# Patient Record
Sex: Male | Born: 1970 | Race: White | Hispanic: No | Smoking: Current every day smoker
Health system: Southern US, Community
[De-identification: ages and names within clinical notes are randomized; demographics above are authoritative.]

## PROBLEM LIST (undated history)

## (undated) DIAGNOSIS — K219 Gastro-esophageal reflux disease without esophagitis: Secondary | ICD-10-CM

## (undated) DIAGNOSIS — E119 Type 2 diabetes mellitus without complications: Secondary | ICD-10-CM

## (undated) DIAGNOSIS — G839 Paralytic syndrome, unspecified: Secondary | ICD-10-CM

---

## 2015-10-30 ENCOUNTER — Emergency Department
Admission: EM | Admit: 2015-10-30 | Discharge: 2015-10-30 | Disposition: A | Discharge status: Home / Self Care | Payer: BLUE CROSS/BLUE SHIELD | Attending: Emergency Medicine | Admitting: Emergency Medicine

## 2015-10-30 DIAGNOSIS — F17200 Nicotine dependence, unspecified, uncomplicated: Secondary | ICD-10-CM | POA: Insufficient documentation

## 2015-10-30 DIAGNOSIS — E119 Type 2 diabetes mellitus without complications: Secondary | ICD-10-CM | POA: Diagnosis not present

## 2015-10-30 DIAGNOSIS — R1030 Lower abdominal pain, unspecified: Secondary | ICD-10-CM | POA: Diagnosis present

## 2015-10-30 DIAGNOSIS — R110 Nausea: Secondary | ICD-10-CM | POA: Diagnosis not present

## 2015-10-30 HISTORY — DX: Type 2 diabetes mellitus without complications: E11.9

## 2015-10-30 LAB — COMPREHENSIVE METABOLIC PANEL
ALK PHOS: 64 U/L (ref 38–126)
ALT: 12 U/L — AB (ref 17–63)
AST: 16 U/L (ref 15–41)
Albumin: 4.3 g/dL (ref 3.5–5.0)
Anion gap: 10 (ref 5–15)
BUN: 20 mg/dL (ref 6–20)
CALCIUM: 9.4 mg/dL (ref 8.9–10.3)
CO2: 25 mmol/L (ref 22–32)
CREATININE: 1.09 mg/dL (ref 0.61–1.24)
Chloride: 104 mmol/L (ref 101–111)
GFR calc non Af Amer: >60 mL/min (ref >60)
GLUCOSE: 117 mg/dL — AB (ref 65–99)
Potassium: 4 mmol/L (ref 3.5–5.1)
SODIUM: 139 mmol/L (ref 135–145)
Total Bilirubin: 0.9 mg/dL (ref 0.3–1.2)
Total Protein: 7.3 g/dL (ref 6.5–8.1)

## 2015-10-30 LAB — URINALYSIS COMPLETE WITH MICROSCOPIC (ARMC ONLY)
Bacteria, UA: NONE SEEN
Bilirubin Urine: NEGATIVE
Hgb urine dipstick: NEGATIVE
Leukocytes, UA: NEGATIVE
Nitrite: NEGATIVE
Protein, ur: NEGATIVE mg/dL
SPECIFIC GRAVITY, URINE: 1.032 — AB (ref 1.005–1.030)
Squamous Epithelial / LPF: NONE SEEN
pH: 5 (ref 5.0–8.0)

## 2015-10-30 LAB — CBC
HCT: 49.6 % (ref 40.0–52.0)
Hemoglobin: 16.7 g/dL (ref 13.0–18.0)
MCH: 29.9 pg (ref 26.0–34.0)
MCHC: 33.6 g/dL (ref 32.0–36.0)
MCV: 88.9 fL (ref 80.0–100.0)
PLATELETS: 253 10*3/uL (ref 150–440)
RBC: 5.58 MIL/uL (ref 4.40–5.90)
RDW: 14 % (ref 11.5–14.5)
WBC: 15.5 10*3/uL — ABNORMAL HIGH (ref 3.8–10.6)

## 2015-10-30 LAB — LIPASE, BLOOD: Lipase: 19 U/L (ref 11–51)

## 2015-10-30 NOTE — ED Provider Notes (Signed)
[facility] Emergency Department Provider Note  ____________________________________________  Time seen: 1805  I have reviewed the triage vital signs and the nursing notes.  History by:  Patient along with his wife  HISTORY  Chief Complaint Abdominal Pain     HPI Jason Weeks is a 44 y.o. male who reports she has had abdominal pain on and off since this past Thursday. Thursday was the worst day, and symptoms have improved since then. Currently is having no pain.  Patient reports the pain is in his lower abdomen, primarily central. He reports some constipation. He usually has a bowel movement once a day. He reports that after he has had bowel movements over the past few days his pain has significantly eased. He had some discomfort today. He has very brief episodes that last just seconds. These occur approximately every 45 minutes. Had a bowel movement just before being seen in the emergency department and is currently pain-free.  Patient has had some nausea. There is been no vomiting. He denies any blood per rectum.  His wife gave him 1 dose of MiraLAX a few days ago. She also gave him a laxative last night. He's had 3 bowel movements today.   Past Medical History  Diagnosis Date  . Diabetes mellitus without complication (HCC)     There are no active problems to display for this patient.   History reviewed. No pertinent past surgical history.  No current outpatient prescriptions on file.  Allergies Codeine and Penicillins  No family history on file.  Social History Social History  Substance Use Topics  . Smoking status: Current Every Day Smoker  . Smokeless tobacco: None  . Alcohol Use: No    Review of Systems  Constitutional: Negative for fever/chills. ENT: Negative for congestion. Cardiovascular: Negative for chest pain. Respiratory: Negative for cough. Gastrointestinal: Some brief episodes of abdominal pain today, pain was worse when  it initiated this past Thursday. See history of present illness Genitourinary: Negative for dysuria. Musculoskeletal: No back pain. Skin: Negative for rash. Neurological: Negative for headache or focal weakness   10-point ROS otherwise negative.  ____________________________________________   PHYSICAL EXAM:  VITAL SIGNS: ED Triage Vitals  Enc Vitals Group     BP 10/30/15 1733 140/73 mmHg     Pulse Rate 10/30/15 1733 83     Resp 10/30/15 1733 17     Temp 10/30/15 1733 97.8 F (36.6 C)     Temp Source 10/30/15 1733 Oral     SpO2 10/30/15 1733 96 %     Weight 10/30/15 1733 187 lb (84.823 kg)     Height 10/30/15 1733 6\' 1"  (1.854 m)     Head Cir --      Peak Flow --      Pain Score 10/30/15 1734 4     Pain Loc --      Pain Edu? --      Excl. in GC? --     Constitutional:  Alert and oriented. Well appearing and in no distress. ENT   Head: Normocephalic and atraumatic.   Nose: No congestion/rhinnorhea.       Mouth: No erythema, no swelling   Cardiovascular: Normal rate, regular rhythm, no murmur noted Respiratory:  Normal respiratory effort, no tachypnea.    Breath sounds are clear and equal bilaterally.  Gastrointestinal: Soft, no distention. Nontender Back: No muscle spasm, no tenderness, no CVA tenderness. Musculoskeletal: No deformity noted. Nontender with normal range of motion in all extremities.  No  noted edema. Neurologic:  Communicative. Normal appearing spontaneous movement in all 4 extremities. No gross focal neurologic deficits are appreciated.  Skin:  Skin is warm, dry. No rash noted. Psychiatric: Mood and affect are normal. Speech and behavior are normal.  ____________________________________________    LABS (pertinent positives/negatives)  Labs Reviewed  COMPREHENSIVE METABOLIC PANEL - Abnormal; Notable for the following:    Glucose, Bld 117 (*)    ALT 12 (*)    All other components within normal limits  CBC - Abnormal; Notable for the  following:    WBC 15.5 (*)    All other components within normal limits  URINALYSIS COMPLETEWITH MICROSCOPIC ([facility] ONLY) - Abnormal; Notable for the following:    Color, Urine YELLOW (*)    APPearance CLEAR (*)    Glucose, UA >500 (*)    Ketones, ur TRACE (*)    Specific Gravity, Urine 1.032 (*)    All other components within normal limits  LIPASE, BLOOD     ____________________________________________  ____________________________________________   PROCEDURES    ____________________________________________   INITIAL IMPRESSION / ASSESSMENT AND PLAN / ED COURSE  Pertinent labs & imaging results that were available during my care of the patient were reviewed by me and considered in my medical decision making (see chart for details).  Well-appearing 44 year old male in no acute distress. He is ambulatory into the room. He denies any abdominal pain at this time.  He has not had any diarrhea. He reports some gurgling in his belly. He has episodic, intermittent discomfort. It does appear that this is related to bowel function.  He does have a white blood cell count of 15,000. He has no signs of appendicitis. There is no tenderness in the right lower quadrant. There is no peritoneal signs. He also does not have signs of diverticulitis.    I do not think that a CT scan is an appropriate study for this patient at this time. As mentioned above, he is pain-free. I discussed this with him and his wife and they agree. Also, given his lack of pain currently, no fever, no diarrhea, I do not see an indication for antibiotics.  Instead, I think the best approach is for the patient take MiraLAX for the next 4-5 days and follow with his primary physician. If his pain worsens during this time, I discussed with him the need to return to the emergency department for reevaluation and for possible CT scan.  ____________________________________________   FINAL CLINICAL IMPRESSION(S) / ED  DIAGNOSES  Final diagnoses:  Lower abdominal pain      Darien Ramus, MD 10/30/15 1825

## 2015-10-30 NOTE — ED Notes (Signed)
Pt c/o lower abd pain since Thursday with nausea and constipation, states he had a very small BM this morning..Marland Kitchen

## 2015-10-30 NOTE — Discharge Instructions (Signed)
At this time, you have no pain in her belly and her belly appears benign on exam. We discussed her white blood cell count which is elevated (15,000) but doesn't tell us much else. If you continue to have problems, he may need to have a CT scan. If her pain worsens, if you have fever, or if you have any other urgent concerns, return to the emergency department for further evaluation and possible CT. Take MiraLAX, one dose tonight, then start tomorrow morning with one dose once a day, for a total of 5 days. Follow-up with your regular doctor later this week or next week.  Abdominal Pain, Adult Many things can cause belly (abdominal) pain. Most times, the belly pain is not dangerous. Many cases of belly pain can be watched and treated at home. HOME CARE   Do not take medicines that help you go poop (laxatives) unless told to by your doctor.  Only take medicine as told by your doctor.  Eat or drink as told by your doctor. Your doctor will tell you if you should be on a special diet. GET HELP IF:  You do not know what is causing your belly pain.  You have belly pain while you are sick to your stomach (nauseous) or have runny poop (diarrhea).  You have pain while you pee or poop.  Your belly pain wakes you up at night.  You have belly pain that gets worse or better when you eat.  You have belly pain that gets worse when you eat fatty foods.  You have a fever. GET HELP RIGHT AWAY IF:   The pain does not go away within 2 hours.  You keep throwing up (vomiting).  The pain changes and is only in the right or left part of the belly.  You have bloody or tarry looking poop. MAKE SURE YOU:   Understand these instructions.  Will watch your condition.  Will get help right away if you are not doing well or get worse.   This information is not intended to replace advice given to you by your health care provider. Make sure you discuss any questions you have with your health care provider.   Document Released: 04/08/2008 Document Revised: 11/11/2014 Document Reviewed: 06/30/2013 Elsevier Interactive Patient Education [employer]2016 [employer] Inc.

## 2015-11-01 ENCOUNTER — Other Ambulatory Visit: Payer: Self-pay | Admitting: Internal Medicine

## 2015-11-01 DIAGNOSIS — R109 Unspecified abdominal pain: Secondary | ICD-10-CM

## 2015-11-02 ENCOUNTER — Ambulatory Visit
Admission: RE | Admit: 2015-11-02 | Discharge: 2015-11-02 | Disposition: A | Discharge status: Home / Self Care | Payer: BLUE CROSS/BLUE SHIELD | Source: Ambulatory Visit | Attending: Internal Medicine | Admitting: Internal Medicine

## 2015-11-02 DIAGNOSIS — R1031 Right lower quadrant pain: Secondary | ICD-10-CM | POA: Insufficient documentation

## 2015-11-02 DIAGNOSIS — R109 Unspecified abdominal pain: Secondary | ICD-10-CM

## 2015-11-02 MED ORDER — IOHEXOL 350 MG/ML SOLN
100.0000 mL | Freq: Once | INTRAVENOUS | Status: AC | PRN
  Administered 2015-11-02: 100 mL via INTRAVENOUS

## 2015-12-06 ENCOUNTER — Other Ambulatory Visit: Payer: Self-pay | Admitting: Gastroenterology

## 2015-12-06 DIAGNOSIS — R198 Other specified symptoms and signs involving the digestive system and abdomen: Secondary | ICD-10-CM

## 2015-12-20 ENCOUNTER — Ambulatory Visit
Admission: RE | Admit: 2015-12-20 | Discharge: 2015-12-20 | Disposition: A | Discharge status: Home / Self Care | Payer: BLUE CROSS/BLUE SHIELD | Source: Ambulatory Visit | Attending: Gastroenterology | Admitting: Gastroenterology

## 2015-12-20 DIAGNOSIS — R918 Other nonspecific abnormal finding of lung field: Secondary | ICD-10-CM | POA: Diagnosis not present

## 2015-12-20 DIAGNOSIS — R198 Other specified symptoms and signs involving the digestive system and abdomen: Secondary | ICD-10-CM

## 2015-12-20 DIAGNOSIS — R933 Abnormal findings on diagnostic imaging of other parts of digestive tract: Secondary | ICD-10-CM | POA: Diagnosis present

## 2015-12-20 LAB — POCT I-STAT CREATININE: Creatinine, Ser: 0.9 mg/dL (ref 0.61–1.24)

## 2015-12-20 MED ORDER — IOHEXOL 350 MG/ML SOLN
125.0000 mL | Freq: Once | INTRAVENOUS | Status: AC | PRN
  Administered 2015-12-20: 125 mL via INTRAVENOUS

## 2016-01-03 HISTORY — PX: UPPER GASTROINTESTINAL ENDOSCOPY: SHX188

## 2016-05-07 IMAGING — CT CT ENTEROGRAPHY (ABD-PELV W/ CM)
2 of 6 series · 15 of 46 positions shown, 17 images · IV contrast (omnipaque)
Comparison: 11/02/2015

CLINICAL DATA: Constipation for 3 days. Small bowel wall thickening
on prior CT.

EXAM:
CT ABDOMEN AND PELVIS WITH CONTRAST (ENTEROGRAPHY)
TECHNIQUE: Multidetector CT of the abdomen and pelvis during bolus
administration of intravenous contrast. Negative oral contrast
VoLumen was given.
CONTRAST:  125mL OMNIPAQUE IOHEXOL 350 MG/ML SOLN

[Series 3: routine abd pel thins · axial · 0.71mm/px · z∈[-984,-554]mm · 12 of 243 slices shown, 14 images]
[im 14/243  soft-tissue]
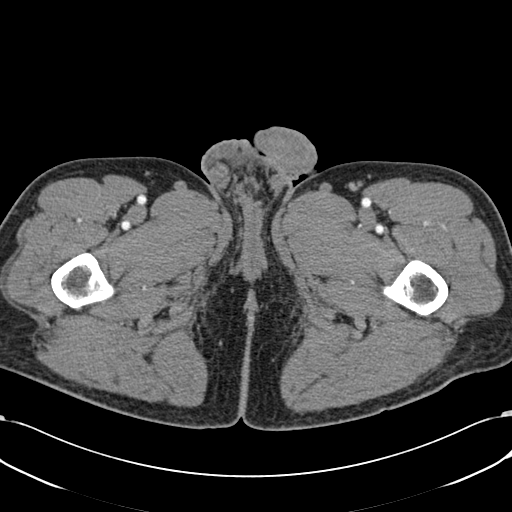
[im 14/243  bone]
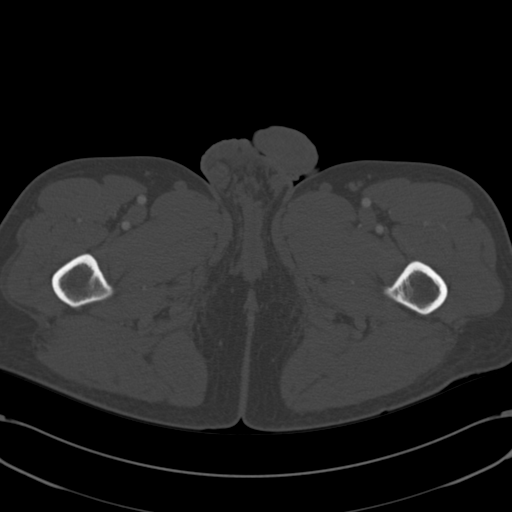
[im 41/243  soft-tissue]
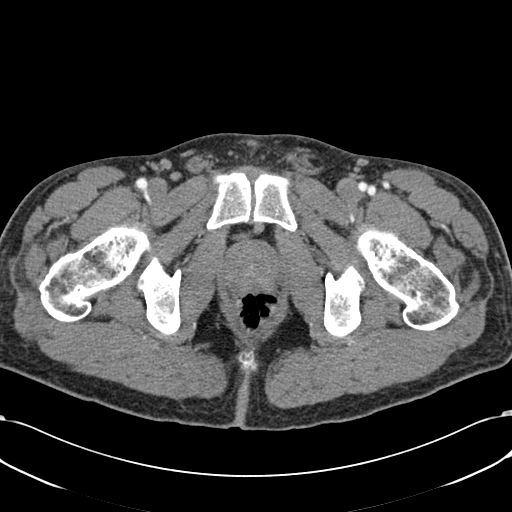
[im 54/243  soft-tissue]
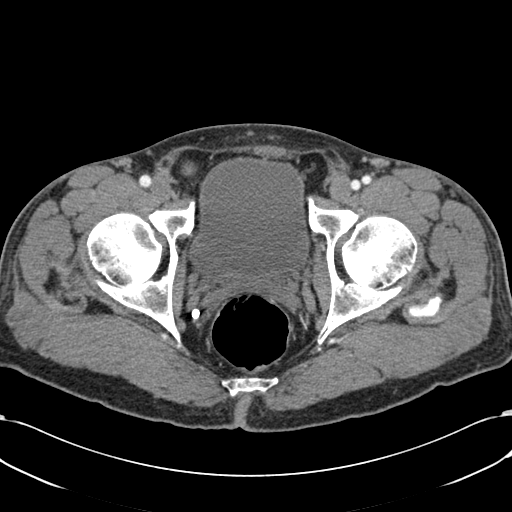
[im 68/243  soft-tissue]
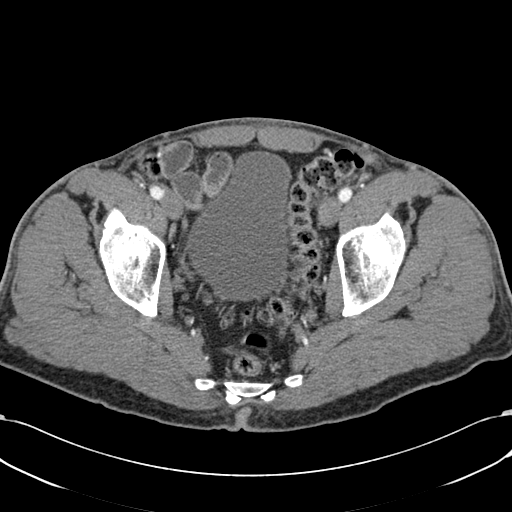
[im 95/243  soft-tissue]
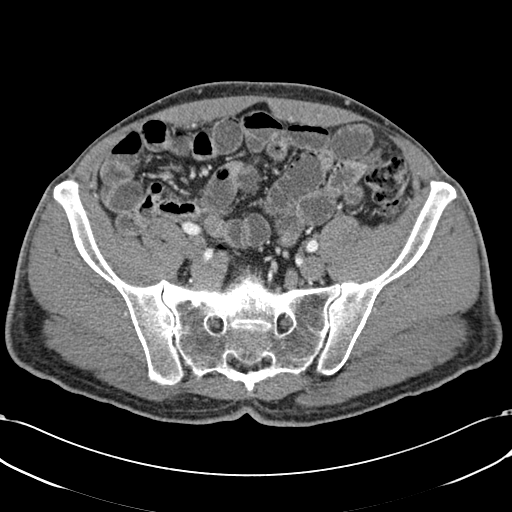
[im 108/243  soft-tissue]
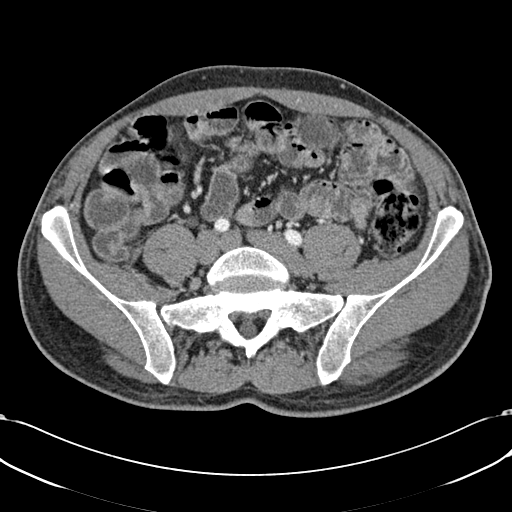
[im 135/243  soft-tissue]
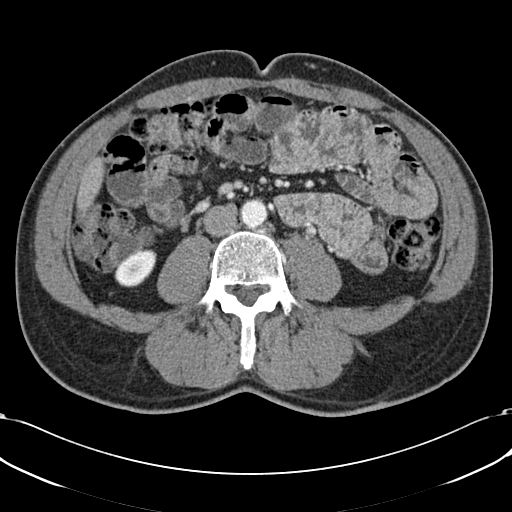
[im 148/243  soft-tissue]
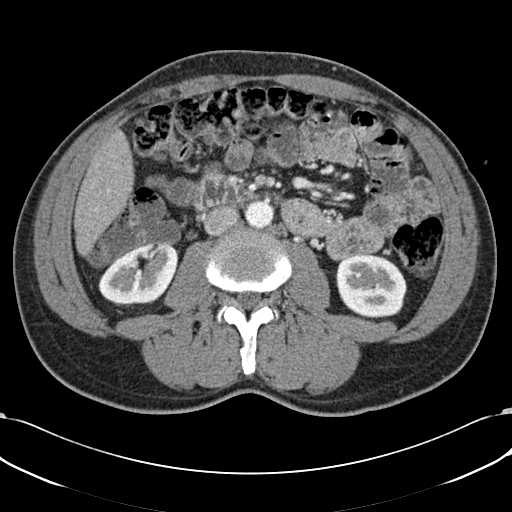
[im 175/243  soft-tissue]
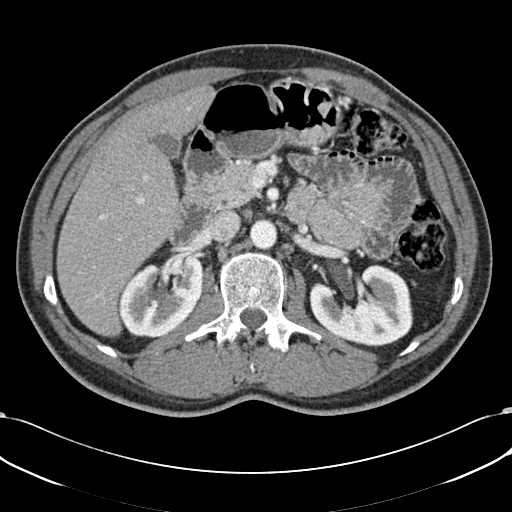
[im 175/243  bone]
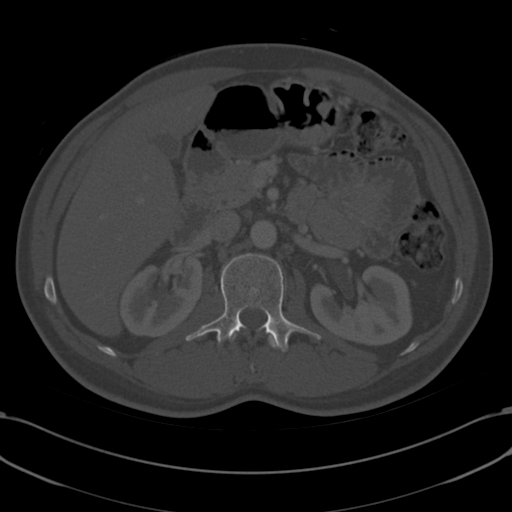
[im 189/243  soft-tissue]
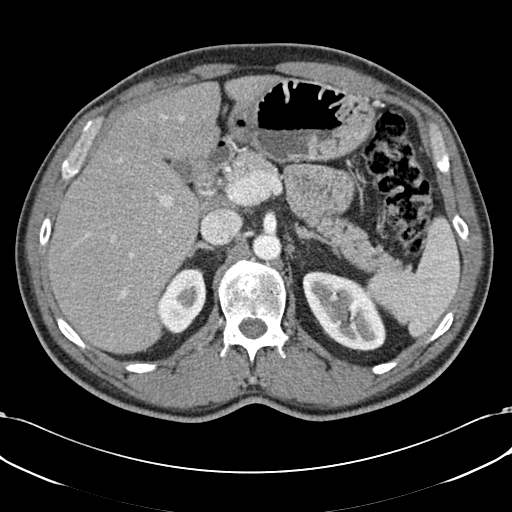
[im 202/243  soft-tissue]
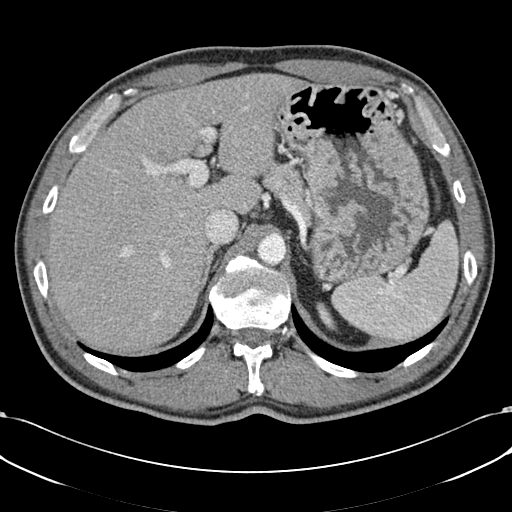
[im 229/243  soft-tissue]
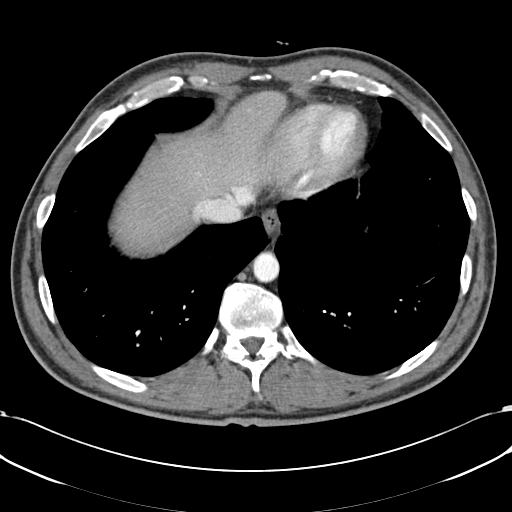

[Series 6: cor routine abd pel with · coronal · 0.73mm/px · 3 of 134 slices shown]
[im 45/134  soft-tissue]
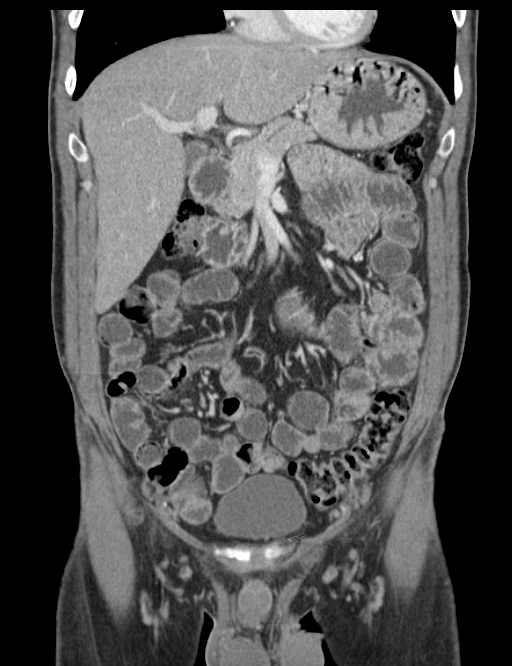
[im 60/134  soft-tissue]
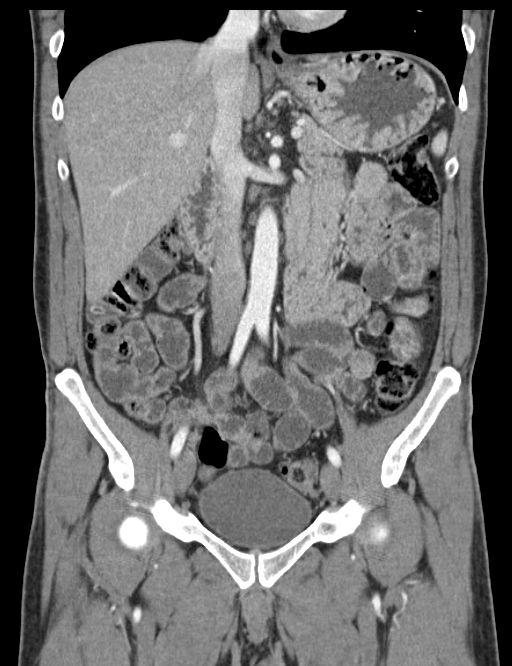
[im 74/134  soft-tissue]
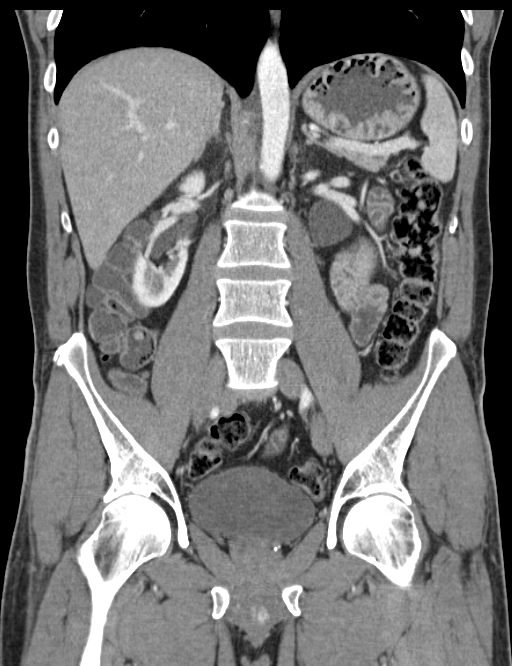

[15 of 46 positions shown; findings below may reference images not displayed]

FINDINGS: Lower chest: 1 mm right lower lobe pulmonary nodule on image
6/series 5, similar. Normal heart size without pericardial or
pleural effusion.

Hepatobiliary: Moderate hepatomegaly, 20.1 cm craniocaudal. No focal
liver lesion. Normal gallbladder, without biliary ductal dilatation.

Pancreas: Normal, without mass or ductal dilatation.

Spleen: Normal in size, without focal abnormality.

Adrenals/Urinary Tract: Normal adrenal glands. Left extrarenal
pelvis. Mildly malrotated right kidney. Normal urinary bladder.

Stomach/Bowel: Greater curvature gastric wall thickening is mild,
including 2.4 cm on image 17/series 2. Similar. Normal colon,
appendix, and terminal ileum. The extent of small bowel distension
with neutral contrast is moderate. No evidence of small bowel mass,
wall thickening, mucosal hyperenhancement, or mesenteric
abnormality. The area of mid small bowel underdistention and
possible wall thickening has resolved and was likely due to
peristalsis.

Vascular/Lymphatic: Normal caliber of the aorta and branch vessels.
No abdominopelvic adenopathy.

Reproductive: Normal prostate.

Other: No significant free fluid.

Musculoskeletal: Degenerative sclerosis of the right sacroiliac
joint.
IMPRESSION: 1. No evidence of small bowel pathology. The previously questioned
abnormality within the mid small bowel has resolved and was likely
due to peristalsis.
2. Mild greater curvature gastric wall thickening, suspicious for
gastritis.
3. Tiny right lung base nodule. If the patient is at high risk for
bronchogenic carcinoma, follow-up chest CT at 1 year is recommended.
If the patient is at low risk, no follow-up is needed. This
recommendation follows the consensus statement: "Guidelines for
Management of Small Pulmonary Nodules Detected on CT Scans: A
Statement from the [HOSPITAL]" as published in Radiology
9551; [DATE]. Available online at:
[URL]
4. Hepatomegaly.

## 2020-07-04 ENCOUNTER — Telehealth: Payer: Self-pay

## 2020-07-04 NOTE — Telephone Encounter (Signed)
Contacted patient for lung CT screening clinic after receiving referral from Dr. Marcello Fennel.  Person who answered phone states Jason Weeks is not available and I agreed someone from our clinic will call him at a later time.  As a side note, patient will qualify for CT screening clinic until he is 31 (he will be 50 on May 16).  Patient will be informed of this when contact made.

## 2020-07-05 ENCOUNTER — Telehealth: Payer: Self-pay

## 2020-07-05 NOTE — Telephone Encounter (Signed)
Contacted patient for lung CT screening program after receiving referral from Dr. Marcello Fennel.  I explained to patient that our program served patients between the ages of 42 and 29 (patient will be 30 on Mar 19 2021).  Patient is agreeable for our program to call him in May of 2022 to see if he would qualify for program. Patient thanked me for the call.

## 2021-03-02 ENCOUNTER — Telehealth: Payer: Self-pay | Admitting: *Deleted

## 2021-03-02 NOTE — Telephone Encounter (Signed)
Received referral for low dose lung cancer screening CT scan. Message left at phone number listed in EMR for patient to call me back to facilitate scheduling scan. Patient will need to be scheduled after 03/19/21.

## 2021-11-01 ENCOUNTER — Ambulatory Visit: Payer: Self-pay | Admitting: General Surgery

## 2021-11-01 NOTE — H&P (View-Only) (Signed)
PATIENT PROFILE: Jason Weeks is a 50 y.o. male who presents to the Clinic for consultation at the request of Dr. Ginette Pitman for evaluation of left inguinal hernia.  PCP:  Azzie Glatter, MD  HISTORY OF PRESENT ILLNESS: [name] reports feeling a left knee hernia since a year ago.  He endorses that he feels pain in the left groin.  Pain does not radiate to other part of the body.  Pain is aggravated by lifting heavy things.  Alleviating factor is laying down resting and reducing the hernia.  Pain continues to increase to the point that it is getting more difficult to reduce the hernia and getting more painful.  Denies any episode of abdominal distention nausea or vomiting.  Patient denies any intra-abdominal surgeries.   PROBLEM LIST: Problem List  Date Reviewed: 04/30/2021          Noted   Inguinal hernia, left 02/25/2020   Tobacco dependence Unknown   Type 2 diabetes mellitus without complication, without long-term current use of insulin (CMS-HCC) 12/11/2015   Moderate tobacco use disorder 12/11/2015   GERD without esophagitis 12/11/2015    GENERAL REVIEW OF SYSTEMS:   General ROS: negative for - chills, fatigue, fever, weight gain or weight loss Allergy and Immunology ROS: negative for - hives  Hematological and Lymphatic ROS: negative for - bleeding problems or bruising, negative for palpable nodes Endocrine ROS: negative for - heat or cold intolerance, hair changes Respiratory ROS: negative for - cough, shortness of breath or wheezing Cardiovascular ROS: no chest pain or palpitations GI ROS: negative for nausea, vomiting, abdominal pain, diarrhea, constipation Musculoskeletal ROS: negative for - joint swelling or muscle pain Neurological ROS: negative for - confusion, syncope Dermatological ROS: negative for pruritus and rash Psychiatric: negative for anxiety, depression, difficulty sleeping and memory loss  MEDICATIONS: Current Outpatient Medications  Medication Sig Dispense  Refill   aspirin 81 MG EC tablet Take 81 mg by mouth once daily.     blood-glucose meter (CONTOUR NEXT METER) Misc Use daily to check blood sugars. CONTOUR NEXT METER e11.9 1 each 0   dapagliflozin (FARXIGA) 10 mg tablet Take 1 tablet (10 mg total) by mouth every morning 90 tablet 1   esomeprazole (NEXIUM) 20 MG DR capsule Take 20 mg by mouth once daily.     JANUMET 50-1,000 mg tablet Take 1 tablet by mouth 2 (two) times daily 180 tablet 1   multivitamin tablet Take 1 tablet by mouth once daily.     blood glucose diagnostic (CONTOUR NEXT TEST STRIPS) test strip Use 2 (two) times daily Contour next strips e11.9 200 each 1   HYDROcodone-acetaminophen (NORCO) 7.5-325 mg tablet  (Patient not taking: Reported on 11/01/2021)     No current facility-administered medications for this visit.    ALLERGIES: Codeine and Penicillin  PAST MEDICAL HISTORY: Past Medical History:  Diagnosis Date   Gastritis 01/04/2016   Intestinal metaplasia of gastric mucosa 01/04/2016   Tobacco dependence    Type 2 diabetes mellitus (CMS-HCC) diagnosis 2012    PAST SURGICAL HISTORY: Past Surgical History:  Procedure Laterality Date   EGD  01/04/2016   Intestinal metaplasia/Gastritis/No Repeat/MGR     FAMILY HISTORY: Family History  Problem Relation Age of Onset   Diabetes Mother    Brain cancer Mother    Diabetes Brother    Diabetes Other    High blood pressure (Hypertension) Other    Brain cancer Other    Stomach cancer Other    No Known  Problems Father    Colon cancer Neg Hx    Colon polyps Neg Hx    Inflammatory bowel disease Neg Hx      SOCIAL HISTORY: Social History   Socioeconomic History   Marital status: Married  Tobacco Use   Smoking status: Every Day    Packs/day: 1.50    Types: Cigarettes   Smokeless tobacco: Never  Substance and Sexual Activity   Alcohol use: No    Alcohol/week: 0.0 standard drinks   Drug use: No   Sexual activity: Defer    PHYSICAL EXAM: Vitals:    11/01/21 0815  BP: 116/75  Pulse: 66   Body mass index is 25.2 kg/m. Weight: 86.6 kg (191 lb)   GENERAL: Alert, active, oriented x3  HEENT: Pupils equal reactive to light. Extraocular movements are intact. Sclera clear. Palpebral conjunctiva normal red color.Pharynx clear.  NECK: Supple with no palpable mass and no adenopathy.  LUNGS: Sound clear with no rales rhonchi or wheezes.  HEART: Regular rhythm S1 and S2 without murmur.  ABDOMEN: Soft and depressible, nontender with no palpable mass, no hepatomegaly.  Left inguinal hernia, reducible.  EXTREMITIES: Well-developed well-nourished symmetrical with no dependent edema.  NEUROLOGICAL: Awake alert oriented, facial expression symmetrical, moving all extremities.  REVIEW OF DATA: I have reviewed the following data today: Appointment on 08/31/2021  Component Date Value   WBC (White Blood Cell Co* 08/31/2021 9.8    RBC (Red Blood Cell Coun* 08/31/2021 5.44    Hemoglobin 08/31/2021 16.7    Hematocrit 08/31/2021 50.5    MCV (Mean Corpuscular Vo* 08/31/2021 92.8    MCH (Mean Corpuscular He* 08/31/2021 30.7    MCHC (Mean Corpuscular H* 08/31/2021 33.1    Platelet Count 08/31/2021 247    RDW-CV (Red Cell Distrib* 08/31/2021 14.0    MPV (Mean Platelet Volum* 08/31/2021 9.7    Neutrophils 08/31/2021 5.28    Lymphocytes 08/31/2021 3.18    Monocytes 08/31/2021 0.93    Eosinophils 08/31/2021 0.32    Basophils 08/31/2021 0.06    Neutrophil % 08/31/2021 53.9    Lymphocyte % 08/31/2021 32.5    Monocyte % 08/31/2021 9.5    Eosinophil % 08/31/2021 3.3    Basophil% 08/31/2021 0.6    Immature Granulocyte % 08/31/2021 0.2    Immature Granulocyte Cou* 08/31/2021 0.02    Glucose 08/31/2021 153 (H)    Sodium 08/31/2021 139    Potassium 08/31/2021 4.4    Chloride 08/31/2021 103    Carbon Dioxide (CO2) 08/31/2021 27.5    Urea Nitrogen (BUN) 08/31/2021 17    Creatinine 08/31/2021 1.1    Glomerular Filtration Ra* 08/31/2021 71    Calcium  08/31/2021 9.5    AST  08/31/2021 14    ALT  08/31/2021 12    Alk Phos (alkaline Phosp* 08/31/2021 70    Albumin 08/31/2021 4.3    Bilirubin, Total 08/31/2021 0.7    Protein, Total 08/31/2021 6.6    A/G Ratio 08/31/2021 1.9    Hemoglobin A1C 08/31/2021 7.6 (H)    Average Blood Glucose (C* 08/31/2021 171    Cholesterol, Total 08/31/2021 167    Triglyceride 08/31/2021 70    HDL (High Density Lipopr* 08/31/2021 47.0    LDL Calculated 08/31/2021 106    VLDL Cholesterol 08/31/2021 14    Cholesterol/HDL Ratio 08/31/2021 3.6    Creatinine, Random Urine 08/31/2021 53.7    Urine Albumin, Random 08/31/2021 <7    Urine Albumin/Creatinine* 08/31/2021 <13.0    Color 08/31/2021 Yellow  Clarity 08/31/2021 Clear    Specific Gravity 08/31/2021 1.010    pH, Urine 08/31/2021 5.5    Protein, Urinalysis 08/31/2021 Negative    Glucose, Urinalysis 08/31/2021 >=1000 (!)    Ketones, Urinalysis 08/31/2021 Negative    Blood, Urinalysis 08/31/2021 Trace (!)    Nitrite, Urinalysis 08/31/2021 Negative    Leukocyte Esterase, Urin* 08/31/2021 Negative    White Blood Cells, Urina* 08/31/2021 None Seen    Red Blood Cells, Urinaly* 08/31/2021 0-3    Bacteria, Urinalysis 08/31/2021 None Seen    Squamous Epithelial Cell* 08/31/2021 None Seen    PSA (Prostate Specific A* 08/31/2021 0.74      ASSESSMENT: Mr. Mcbryar is a 50 y.o. male presenting for consultation for left versus bilateral inguinal hernia.    The patient presents with a symptomatic left versus bilateral, reducible inguinal hernia. Patient was oriented about the diagnosis of inguinal hernia and its implication. The patient was oriented about the treatment alternatives (observation vs surgical repair). Due to patient symptoms, repair is recommended. Patient oriented about the surgical procedure, the use of mesh and its risk of complications such as: infection, bleeding, injury to vas deference, vasculature and testicle, injury to bowel or bladder, and  chronic pain.   Since the pain continue to increase especially with activity and sitting more difficult to reduce the hernia, I think that is reasonable to proceed with hernia repair.  I discussed with the patient that he has increased risk of recurrence and complication due to his hemoglobin A1c of 7.6 and he is currently smoking.  Patient not willing to stop smoking at this moment.  Non-recurrent unilateral inguinal hernia without obstruction or gangrene [K40.90]  PLAN: 1.  Robotic assisted laparoscopic left vs bilateral inguinal hernia repair with mesh (26691) 2.  CBC, CMP done 3.  Avoid taking aspirin 5 days before procedure 4.  Contact us if has any question or concern.   Patient verbalized understanding, all questions were answered, and were agreeable with the plan outlined above.    Herbert Pun, MD  Electronically signed by Herbert Pun, MD

## 2021-11-01 NOTE — H&P (Signed)
PATIENT PROFILE: Jason Weeks is a 50 y.o. male who presents to the Clinic for consultation at the request of Dr. Ginette Pitman for evaluation of left inguinal hernia.  PCP:  Azzie Glatter, MD  HISTORY OF PRESENT ILLNESS: Jason Weeks reports feeling a left knee hernia since a year ago.  He endorses that he feels pain in the left groin.  Pain does not radiate to other part of the body.  Pain is aggravated by lifting heavy things.  Alleviating factor is laying down resting and reducing the hernia.  Pain continues to increase to the point that it is getting more difficult to reduce the hernia and getting more painful.  Denies any episode of abdominal distention nausea or vomiting.  Patient denies any intra-abdominal surgeries.   PROBLEM LIST: Problem List  Date Reviewed: 04/30/2021          Noted   Inguinal hernia, left 02/25/2020   Tobacco dependence Unknown   Type 2 diabetes mellitus without complication, without long-term current use of insulin (CMS-HCC) 12/11/2015   Moderate tobacco use disorder 12/11/2015   GERD without esophagitis 12/11/2015    GENERAL REVIEW OF SYSTEMS:   General ROS: negative for - chills, fatigue, fever, weight gain or weight loss Allergy and Immunology ROS: negative for - hives  Hematological and Lymphatic ROS: negative for - bleeding problems or bruising, negative for palpable nodes Endocrine ROS: negative for - heat or cold intolerance, hair changes Respiratory ROS: negative for - cough, shortness of breath or wheezing Cardiovascular ROS: no chest pain or palpitations GI ROS: negative for nausea, vomiting, abdominal pain, diarrhea, constipation Musculoskeletal ROS: negative for - joint swelling or muscle pain Neurological ROS: negative for - confusion, syncope Dermatological ROS: negative for pruritus and rash Psychiatric: negative for anxiety, depression, difficulty sleeping and memory loss  MEDICATIONS: Current Outpatient Medications  Medication Sig Dispense  Refill   aspirin 81 MG EC tablet Take 81 mg by mouth once daily.     blood-glucose meter (CONTOUR NEXT METER) Misc Use daily to check blood sugars. CONTOUR NEXT METER e11.9 1 each 0   dapagliflozin (FARXIGA) 10 mg tablet Take 1 tablet (10 mg total) by mouth every morning 90 tablet 1   esomeprazole (NEXIUM) 20 MG DR capsule Take 20 mg by mouth once daily.     JANUMET 50-1,000 mg tablet Take 1 tablet by mouth 2 (two) times daily 180 tablet 1   multivitamin tablet Take 1 tablet by mouth once daily.     blood glucose diagnostic (CONTOUR NEXT TEST STRIPS) test strip Use 2 (two) times daily Contour next strips e11.9 200 each 1   HYDROcodone-acetaminophen (NORCO) 7.5-325 mg tablet  (Patient not taking: Reported on 11/01/2021)     No current facility-administered medications for this visit.    ALLERGIES: Codeine and Penicillin  PAST MEDICAL HISTORY: Past Medical History:  Diagnosis Date   Gastritis 01/04/2016   Intestinal metaplasia of gastric mucosa 01/04/2016   Tobacco dependence    Type 2 diabetes mellitus (CMS-HCC) diagnosis 2012    PAST SURGICAL HISTORY: Past Surgical History:  Procedure Laterality Date   EGD  01/04/2016   Intestinal metaplasia/Gastritis/No Repeat/MGR     FAMILY HISTORY: Family History  Problem Relation Age of Onset   Diabetes Mother    Brain cancer Mother    Diabetes Brother    Diabetes Other    High blood pressure (Hypertension) Other    Brain cancer Other    Stomach cancer Other    No Known  Problems Father    Colon cancer Neg Hx    Colon polyps Neg Hx    Inflammatory bowel disease Neg Hx      SOCIAL HISTORY: Social History   Socioeconomic History   Marital status: Married  Tobacco Use   Smoking status: Every Day    Packs/day: 1.50    Types: Cigarettes   Smokeless tobacco: Never  Substance and Sexual Activity   Alcohol use: No    Alcohol/week: 0.0 standard drinks   Drug use: No   Sexual activity: Defer    PHYSICAL EXAM: Vitals:    11/01/21 0815  BP: 116/75  Pulse: 66   Body mass index is 25.2 kg/m. Weight: 86.6 kg (191 lb)   GENERAL: Alert, active, oriented x3  HEENT: Pupils equal reactive to light. Extraocular movements are intact. Sclera clear. Palpebral conjunctiva normal red color.Pharynx clear.  NECK: Supple with no palpable mass and no adenopathy.  LUNGS: Sound clear with no rales rhonchi or wheezes.  HEART: Regular rhythm S1 and S2 without murmur.  ABDOMEN: Soft and depressible, nontender with no palpable mass, no hepatomegaly.  Left inguinal hernia, reducible.  EXTREMITIES: Well-developed well-nourished symmetrical with no dependent edema.  NEUROLOGICAL: Awake alert oriented, facial expression symmetrical, moving all extremities.  REVIEW OF DATA: I have reviewed the following data today: Appointment on 08/31/2021  Component Date Value   WBC (White Blood Cell Co* 08/31/2021 9.8    RBC (Red Blood Cell Coun* 08/31/2021 5.44    Hemoglobin 08/31/2021 16.7    Hematocrit 08/31/2021 50.5    MCV (Mean Corpuscular Vo* 08/31/2021 92.8    MCH (Mean Corpuscular He* 08/31/2021 30.7    MCHC (Mean Corpuscular H* 08/31/2021 33.1    Platelet Count 08/31/2021 247    RDW-CV (Red Cell Distrib* 08/31/2021 14.0    MPV (Mean Platelet Volum* 08/31/2021 9.7    Neutrophils 08/31/2021 5.28    Lymphocytes 08/31/2021 3.18    Monocytes 08/31/2021 0.93    Eosinophils 08/31/2021 0.32    Basophils 08/31/2021 0.06    Neutrophil % 08/31/2021 53.9    Lymphocyte % 08/31/2021 32.5    Monocyte % 08/31/2021 9.5    Eosinophil % 08/31/2021 3.3    Basophil% 08/31/2021 0.6    Immature Granulocyte % 08/31/2021 0.2    Immature Granulocyte Cou* 08/31/2021 0.02    Glucose 08/31/2021 153 (H)    Sodium 08/31/2021 139    Potassium 08/31/2021 4.4    Chloride 08/31/2021 103    Carbon Dioxide (CO2) 08/31/2021 27.5    Urea Nitrogen (BUN) 08/31/2021 17    Creatinine 08/31/2021 1.1    Glomerular Filtration Ra* 08/31/2021 71    Calcium  08/31/2021 9.5    AST  08/31/2021 14    ALT  08/31/2021 12    Alk Phos (alkaline Phosp* 08/31/2021 70    Albumin 08/31/2021 4.3    Bilirubin, Total 08/31/2021 0.7    Protein, Total 08/31/2021 6.6    A/G Ratio 08/31/2021 1.9    Hemoglobin A1C 08/31/2021 7.6 (H)    Average Blood Glucose (C* 08/31/2021 171    Cholesterol, Total 08/31/2021 167    Triglyceride 08/31/2021 70    HDL (High Density Lipopr* 08/31/2021 47.0    LDL Calculated 08/31/2021 106    VLDL Cholesterol 08/31/2021 14    Cholesterol/HDL Ratio 08/31/2021 3.6    Creatinine, Random Urine 08/31/2021 53.7    Urine Albumin, Random 08/31/2021 <7    Urine Albumin/Creatinine* 08/31/2021 <13.0    Color 08/31/2021 Yellow  Clarity 08/31/2021 Clear    Specific Gravity 08/31/2021 1.010    pH, Urine 08/31/2021 5.5    Protein, Urinalysis 08/31/2021 Negative    Glucose, Urinalysis 08/31/2021 >=1000 (!)    Ketones, Urinalysis 08/31/2021 Negative    Blood, Urinalysis 08/31/2021 Trace (!)    Nitrite, Urinalysis 08/31/2021 Negative    Leukocyte Esterase, Urin* 08/31/2021 Negative    White Blood Cells, Urina* 08/31/2021 None Seen    Red Blood Cells, Urinaly* 08/31/2021 0-3    Bacteria, Urinalysis 08/31/2021 None Seen    Squamous Epithelial Cell* 08/31/2021 None Seen    PSA (Prostate Specific A* 08/31/2021 0.74      ASSESSMENT: Mr. Mcbryar is a 50 y.o. male presenting for consultation for left versus bilateral inguinal hernia.    The patient presents with a symptomatic left versus bilateral, reducible inguinal hernia. Patient was oriented about the diagnosis of inguinal hernia and its implication. The patient was oriented about the treatment alternatives (observation vs surgical repair). Due to patient symptoms, repair is recommended. Patient oriented about the surgical procedure, the use of mesh and its risk of complications such as: infection, bleeding, injury to vas deference, vasculature and testicle, injury to bowel or bladder, and  chronic pain.   Since the pain continue to increase especially with activity and sitting more difficult to reduce the hernia, I think that is reasonable to proceed with hernia repair.  I discussed with the patient that he has increased risk of recurrence and complication due to his hemoglobin A1c of 7.6 and he is currently smoking.  Patient not willing to stop smoking at this moment.  Non-recurrent unilateral inguinal hernia without obstruction or gangrene [K40.90]  PLAN: 1.  Robotic assisted laparoscopic left vs bilateral inguinal hernia repair with mesh (26691) 2.  CBC, CMP done 3.  Avoid taking aspirin 5 days before procedure 4.  Contact us if has any question or concern.   Patient verbalized understanding, all questions were answered, and were agreeable with the plan outlined above.    Herbert Pun, MD  Electronically signed by Herbert Pun, MD

## 2021-11-07 ENCOUNTER — Other Ambulatory Visit: Payer: Self-pay

## 2021-11-07 ENCOUNTER — Encounter
Admission: RE | Admit: 2021-11-07 | Discharge: 2021-11-07 | Disposition: A | Discharge status: Home / Self Care | Payer: BC Managed Care – PPO | Source: Ambulatory Visit | Attending: General Surgery | Admitting: General Surgery

## 2021-11-07 HISTORY — DX: Gastro-esophageal reflux disease without esophagitis: K21.9

## 2021-11-07 HISTORY — DX: Paralytic syndrome, unspecified: G83.9

## 2021-11-07 NOTE — Patient Instructions (Signed)
Your procedure is scheduled on: 11/14/21 Report to DAY SURGERY DEPARTMENT LOCATED ON 2ND FLOOR MEDICAL MALL ENTRANCE. To find out your arrival time please call [phone] between 1PM - 3PM on 11/13/21.  Remember: Instructions that are not followed completely may result in serious medical risk, up to and including death, or upon the discretion of your surgeon and anesthesiologist your surgery may need to be rescheduled.     _X__ 1. Do not eat food OR DRINK ANY LIQUIDS after midnight the night before your procedure.                 No gum chewing or hard candies.   __X__2.  On the morning of surgery brush your teeth with toothpaste and water, you                 may rinse your mouth with mouthwash if you wish.  Do not swallow any              toothpaste of mouthwash.     _X__ 3.  No Alcohol for 24 hours before or after surgery.   _X__ 4.  Do Not Smoke or use e-cigarettes For 24 Hours Prior to Your Surgery.                 Do not use any chewable tobacco products for at least 6 hours prior to                 surgery.  ____  5.  Bring all medications with you on the day of surgery if instructed.   __X__  6.  Notify your doctor if there is any change in your medical condition      (cold, fever, infections).     Do not wear jewelry, make-up, hairpins, clips or nail polish. Do not wear lotions, powders, or perfumes.  Do not shave 48 hours prior to surgery. Men may shave face and neck. Do not bring valuables to the hospital.    [facility] is not responsible for any belongings or valuables.  Contacts, dentures/partials or body piercings may not be worn into surgery. Bring a case for your contacts, glasses or hearing aids, a denture cup will be supplied. Leave your suitcase in the car. After surgery it may be brought to your room. For patients admitted to the hospital, discharge time is determined by your treatment team.   Patients discharged the day of surgery will not be allowed to drive  home.   Please read over the following fact sheets that you were given:   MRSA Information  __X__ Take these medicines the morning of surgery with A SIP OF WATER:    1. esomeprazole (NEXIUM) 20 MG capsule  2.   3.   4.  5.  6.  ____ Fleet Enema (as directed)   __X__ Use CHG Soap/SAGE wipes as directed  ____ Use inhalers on the day of surgery  __X__ Stop metformin/Janumet/Farxiga 2 days prior to surgery Last dose Marcelline Deist Sun 11/11/21, Janumet Mon 11/12/21. Restart after surgery   ____ Take 1/2 of usual insulin dose the night before surgery. No insulin the morning          of surgery.   ____ Stop Blood Thinners Coumadin/Plavix/Xarelto/Pleta/Pradaxa/Eliquis/Effient/Aspirin  on   Or contact your Surgeon, Cardiologist or Medical Doctor regarding  ability to stop your blood thinners  __X__ Stop Anti-inflammatories 7 days before surgery such as Advil, Ibuprofen, Motrin,  BC or Goodies Powder, Naprosyn, Naproxen, Aleve  __X__ Stop all herbal supplements, fish oil or vitamin E until after surgery.    ____ Bring C-Pap to the hospital.   Hold Aspirin 5 days. Last dose Fri 11/09/21

## 2021-11-14 ENCOUNTER — Ambulatory Visit
Admission: RE | Admit: 2021-11-14 | Discharge: 2021-11-14 | Disposition: A | Discharge status: Home / Self Care | Payer: BC Managed Care – PPO | Attending: General Surgery | Admitting: General Surgery

## 2021-11-14 ENCOUNTER — Ambulatory Visit: Payer: BC Managed Care – PPO | Admitting: Certified Registered"

## 2021-11-14 ENCOUNTER — Encounter
Admission: RE | Disposition: A | Discharge status: Home / Self Care | Payer: Self-pay | Source: Home / Self Care | Attending: General Surgery

## 2021-11-14 ENCOUNTER — Encounter: Payer: Self-pay | Admitting: General Surgery

## 2021-11-14 DIAGNOSIS — Z7984 Long term (current) use of oral hypoglycemic drugs: Secondary | ICD-10-CM | POA: Diagnosis not present

## 2021-11-14 DIAGNOSIS — K219 Gastro-esophageal reflux disease without esophagitis: Secondary | ICD-10-CM | POA: Diagnosis not present

## 2021-11-14 DIAGNOSIS — K4020 Bilateral inguinal hernia, without obstruction or gangrene, not specified as recurrent: Secondary | ICD-10-CM | POA: Diagnosis not present

## 2021-11-14 DIAGNOSIS — E119 Type 2 diabetes mellitus without complications: Secondary | ICD-10-CM | POA: Insufficient documentation

## 2021-11-14 DIAGNOSIS — F17210 Nicotine dependence, cigarettes, uncomplicated: Secondary | ICD-10-CM | POA: Insufficient documentation

## 2021-11-14 DIAGNOSIS — G839 Paralytic syndrome, unspecified: Secondary | ICD-10-CM | POA: Insufficient documentation

## 2021-11-14 DIAGNOSIS — K4090 Unilateral inguinal hernia, without obstruction or gangrene, not specified as recurrent: Secondary | ICD-10-CM | POA: Diagnosis present

## 2021-11-14 LAB — GLUCOSE, CAPILLARY
Glucose-Capillary: 212 mg/dL — ABNORMAL HIGH (ref 70–99)
Glucose-Capillary: 145 mg/dL — ABNORMAL HIGH (ref 70–99)

## 2021-11-14 SURGERY — REPAIR, HERNIA, INGUINAL, BILATERAL, ROBOT-ASSISTED
Anesthesia: General | Site: Groin | Laterality: Bilateral

## 2021-11-14 MED ORDER — ONDANSETRON HCL 4 MG/2ML IJ SOLN
INTRAMUSCULAR | Status: AC
  Filled 2021-11-14: qty 2

## 2021-11-14 MED ORDER — CEFAZOLIN SODIUM-DEXTROSE 2-4 GM/100ML-% IV SOLN
2.0000 g | INTRAVENOUS | Status: AC
  Administered 2021-11-14: 2 g via INTRAVENOUS

## 2021-11-14 MED ORDER — LIDOCAINE HCL (PF) 2 % IJ SOLN
INTRAMUSCULAR | Status: AC
  Filled 2021-11-14: qty 5

## 2021-11-14 MED ORDER — TRAMADOL HCL 50 MG PO TABS
50.0000 mg | ORAL_TABLET | Freq: Four times a day (QID) | ORAL | Status: DC
  Administered 2021-11-14: 50 mg via ORAL

## 2021-11-14 MED ORDER — FENTANYL CITRATE (PF) 100 MCG/2ML IJ SOLN: 25.0000 ug | INTRAMUSCULAR | Status: DC | PRN

## 2021-11-14 MED ORDER — BUPIVACAINE-EPINEPHRINE (PF) 0.25% -1:200000 IJ SOLN
INTRAMUSCULAR | Status: AC
  Filled 2021-11-14: qty 30

## 2021-11-14 MED ORDER — 0.9 % SODIUM CHLORIDE (POUR BTL) OPTIME
TOPICAL | Status: DC | PRN
  Administered 2021-11-14: 100 mL

## 2021-11-14 MED ORDER — SODIUM CHLORIDE 0.9 % IV SOLN: INTRAVENOUS | Status: DC

## 2021-11-14 MED ORDER — CEFAZOLIN SODIUM-DEXTROSE 2-4 GM/100ML-% IV SOLN
INTRAVENOUS | Status: AC
  Filled 2021-11-14: qty 100

## 2021-11-14 MED ORDER — BUPIVACAINE-EPINEPHRINE 0.25% -1:200000 IJ SOLN
INTRAMUSCULAR | Status: DC | PRN
  Administered 2021-11-14: 24 mL

## 2021-11-14 MED ORDER — CHLORHEXIDINE GLUCONATE 0.12 % MT SOLN
OROMUCOSAL | Status: AC
  Filled 2021-11-14: qty 15

## 2021-11-14 MED ORDER — ROCURONIUM BROMIDE 100 MG/10ML IV SOLN
INTRAVENOUS | Status: DC | PRN
  Administered 2021-11-14: 50 mg via INTRAVENOUS
  Administered 2021-11-14: 30 mg via INTRAVENOUS
  Administered 2021-11-14 (×2): 20 mg via INTRAVENOUS

## 2021-11-14 MED ORDER — SUGAMMADEX SODIUM 500 MG/5ML IV SOLN
INTRAVENOUS | Status: AC
  Filled 2021-11-14: qty 5

## 2021-11-14 MED ORDER — MIDAZOLAM HCL 2 MG/2ML IJ SOLN
INTRAMUSCULAR | Status: DC | PRN
  Administered 2021-11-14: 2 mg via INTRAVENOUS

## 2021-11-14 MED ORDER — LIDOCAINE HCL (CARDIAC) PF 100 MG/5ML IV SOSY
PREFILLED_SYRINGE | INTRAVENOUS | Status: DC | PRN
  Administered 2021-11-14: 100 mg via INTRAVENOUS

## 2021-11-14 MED ORDER — LACTATED RINGERS IV SOLN: INTRAVENOUS | Status: DC | PRN

## 2021-11-14 MED ORDER — TRAMADOL HCL 50 MG PO TABS
ORAL_TABLET | ORAL | Status: AC
  Filled 2021-11-14: qty 1

## 2021-11-14 MED ORDER — CHLORHEXIDINE GLUCONATE 0.12 % MT SOLN
15.0000 mL | Freq: Once | OROMUCOSAL | Status: AC
Start: 2021-11-14 — End: 2021-11-14
  Administered 2021-11-14: 15 mL via OROMUCOSAL

## 2021-11-14 MED ORDER — ROCURONIUM BROMIDE 10 MG/ML (PF) SYRINGE
PREFILLED_SYRINGE | INTRAVENOUS | Status: AC
  Filled 2021-11-14: qty 10

## 2021-11-14 MED ORDER — PROMETHAZINE HCL 25 MG/ML IJ SOLN: 6.2500 mg | INTRAMUSCULAR | Status: DC | PRN

## 2021-11-14 MED ORDER — HYDROMORPHONE HCL 1 MG/ML IJ SOLN
INTRAMUSCULAR | Status: AC
  Filled 2021-11-14: qty 1

## 2021-11-14 MED ORDER — GLYCOPYRROLATE 0.2 MG/ML IJ SOLN
INTRAMUSCULAR | Status: DC | PRN
  Administered 2021-11-14: .2 mg via INTRAVENOUS

## 2021-11-14 MED ORDER — PHENYLEPHRINE HCL-NACL 20-0.9 MG/250ML-% IV SOLN
INTRAVENOUS | Status: DC | PRN
Start: 2021-11-14 — End: 2021-11-14
  Administered 2021-11-14: 50 ug/min via INTRAVENOUS

## 2021-11-14 MED ORDER — TRAMADOL HCL 50 MG PO TABS: 50.0000 mg | ORAL_TABLET | Freq: Four times a day (QID) | ORAL | 0 refills | Status: AC | PRN

## 2021-11-14 MED ORDER — PROPOFOL 10 MG/ML IV BOLUS
INTRAVENOUS | Status: DC | PRN
  Administered 2021-11-14: 200 mg via INTRAVENOUS

## 2021-11-14 MED ORDER — MIDAZOLAM HCL 2 MG/2ML IJ SOLN
INTRAMUSCULAR | Status: AC
  Filled 2021-11-14: qty 2

## 2021-11-14 MED ORDER — ORAL CARE MOUTH RINSE: 15.0000 mL | Freq: Once | OROMUCOSAL | Status: AC

## 2021-11-14 SURGICAL SUPPLY — 47 items
ADH SKN CLS APL DERMABOND .7 (GAUZE/BANDAGES/DRESSINGS) ×1
BAG INFUSER PRESSURE 100CC (MISCELLANEOUS) IMPLANT
BLADE SURG SZ11 CARB STEEL (BLADE) ×2 IMPLANT
COVER TIP SHEARS 8 DVNC (MISCELLANEOUS) ×1 IMPLANT
COVER TIP SHEARS 8MM DA VINCI (MISCELLANEOUS) ×1
COVER WAND RF STERILE (DRAPES) ×2 IMPLANT
DERMABOND ADVANCED (GAUZE/BANDAGES/DRESSINGS) ×1
DERMABOND ADVANCED .7 DNX12 (GAUZE/BANDAGES/DRESSINGS) ×1 IMPLANT
DRAPE ARM DVNC X/XI (DISPOSABLE) ×3 IMPLANT
DRAPE COLUMN DVNC XI (DISPOSABLE) ×1 IMPLANT
DRAPE DA VINCI XI ARM (DISPOSABLE) ×3
DRAPE DA VINCI XI COLUMN (DISPOSABLE) ×1
ELECT REM PT RETURN 9FT ADLT (ELECTROSURGICAL) ×2
ELECTRODE REM PT RTRN 9FT ADLT (ELECTROSURGICAL) ×1 IMPLANT
GAUZE 4X4 16PLY ~~LOC~~+RFID DBL (SPONGE) ×2 IMPLANT
GLOVE SURG ENC MOIS LTX SZ6.5 (GLOVE) ×4 IMPLANT
GLOVE SURG UNDER POLY LF SZ6.5 (GLOVE) ×4 IMPLANT
GOWN STRL REUS W/ TWL LRG LVL3 (GOWN DISPOSABLE) ×3 IMPLANT
GOWN STRL REUS W/TWL LRG LVL3 (GOWN DISPOSABLE) ×6
IV CATH ANGIO 12GX3 LT BLUE (NEEDLE) ×1 IMPLANT
KIT PINK PAD W/HEAD ARE REST (MISCELLANEOUS) ×2
KIT PINK PAD W/HEAD ARM REST (MISCELLANEOUS) ×1 IMPLANT
LABEL OR SOLS (LABEL) ×1 IMPLANT
MANIFOLD NEPTUNE II (INSTRUMENTS) ×2 IMPLANT
MESH 3DMAX 5X7 LT XLRG (Mesh General) ×1 IMPLANT
MESH 3DMAX 5X7 RT XLRG (Mesh General) ×1 IMPLANT
MESH 3DMAX MID 5X7 LT XLRG (Mesh General) IMPLANT
MESH 3DMAX MID 5X7 RT XLRG (Mesh General) IMPLANT
NDL INSUFFLATION 14GA 120MM (NEEDLE) ×1 IMPLANT
NEEDLE HYPO 22GX1.5 SAFETY (NEEDLE) ×2 IMPLANT
NEEDLE INSUFFLATION 14GA 120MM (NEEDLE) ×2 IMPLANT
OBTURATOR OPTICAL STANDARD 8MM (TROCAR) ×1
OBTURATOR OPTICAL STND 8 DVNC (TROCAR) ×1
OBTURATOR OPTICALSTD 8 DVNC (TROCAR) ×1 IMPLANT
PACK LAP CHOLECYSTECTOMY (MISCELLANEOUS) ×2 IMPLANT
SEAL CANN UNIV 5-8 DVNC XI (MISCELLANEOUS) ×3 IMPLANT
SEAL XI 5MM-8MM UNIVERSAL (MISCELLANEOUS) ×3
SET TUBE SMOKE EVAC HIGH FLOW (TUBING) ×2 IMPLANT
SOLUTION ELECTROLUBE (MISCELLANEOUS) ×2 IMPLANT
SUT MNCRL 4-0 (SUTURE) ×2
SUT MNCRL 4-0 27XMFL (SUTURE) ×1
SUT VIC AB 2-0 SH 27 (SUTURE) ×2
SUT VIC AB 2-0 SH 27XBRD (SUTURE) ×1 IMPLANT
SUT VLOC 90 S/L VL9 GS22 (SUTURE) ×3 IMPLANT
SUTURE MNCRL 4-0 27XMF (SUTURE) ×1 IMPLANT
TAPE TRANSPORE STRL 2 31045 (GAUZE/BANDAGES/DRESSINGS) ×1 IMPLANT
WATER STERILE IRR 500ML POUR (IV SOLUTION) ×2 IMPLANT

## 2021-11-14 NOTE — Anesthesia Procedure Notes (Addendum)
Procedure Name: Intubation Date/Time: 11/14/2021 12:41 PM Performed by: Beverely Low, CRNA Pre-anesthesia Checklist: Patient identified, Emergency Drugs available, Suction available and Patient being monitored Patient Re-evaluated:Patient Re-evaluated prior to induction Oxygen Delivery Method: Circle system utilized Preoxygenation: Pre-oxygenation with 100% oxygen Induction Type: IV induction Ventilation: Mask ventilation without difficulty Laryngoscope Size: McGraph and 4 Grade View: Grade I Tube type: Oral Tube size: 7.5 mm Number of attempts: 1 Airway Equipment and Method: Stylet and Oral airway Placement Confirmation: ETT inserted through vocal cords under direct vision, positive ETCO2 and breath sounds checked- equal and bilateral Tube secured with: Tape Dental Injury: Teeth and Oropharynx as per pre-operative assessment

## 2021-11-14 NOTE — Transfer of Care (Signed)
Immediate Anesthesia Transfer of Care Note  Patient: Jason Weeks  Procedure(s) Performed: XI ROBOTIC ASSISTED BILATERAL INGUINAL HERNIA (Bilateral: Groin)  Patient Location: PACU  Anesthesia Type:General    Level of Consciousness: sedated  Airway & Oxygen Therapy: Patient Spontanous Breathing  Post-op Assessment: Post -op Vital signs reviewed and stable  Post vital signs: Reviewed and stable  Last Vitals:  Vitals Value Taken Time  BP 132/75 11/14/21 1500  Temp 36.3 C 11/14/21 1451  Pulse 55 11/14/21 1504  Resp 16 11/14/21 1504  SpO2 95 % 11/14/21 1504  Vitals shown include unvalidated device data.  Last Pain:  Vitals:   11/14/21 1500  TempSrc:   PainSc: Asleep         Complications: No notable events documented.

## 2021-11-14 NOTE — Anesthesia Preprocedure Evaluation (Signed)
Anesthesia Evaluation  Patient identified by MRN, date of birth, ID band Patient awake    Reviewed: Allergy & Precautions, H&P , NPO status , Patient's Chart, lab work & pertinent test results, reviewed documented beta blocker date and time   History of Anesthesia Complications Negative for: history of anesthetic complications  Airway Mallampati: II  TM Distance: >3 FB Neck ROM: full    Dental  (+) Dental Advidsory Given, Missing, Poor Dentition   Pulmonary neg shortness of breath, neg COPD, neg recent URI, Current Smoker and Patient abstained from smoking.,    Pulmonary exam normal breath sounds clear to auscultation       Cardiovascular Exercise Tolerance: Good negative cardio ROS Normal cardiovascular exam Rhythm:regular Rate:Normal     Neuro/Psych negative neurological ROS  negative psych ROS   GI/Hepatic Neg liver ROS, GERD  Medicated and Controlled,  Endo/Other  diabetes, Well Controlled, Type 2, Oral Hypoglycemic Agents  Renal/GU negative Renal ROS  negative genitourinary   Musculoskeletal   Abdominal   Peds  Hematology negative hematology ROS (+)   Anesthesia Other Findings Past Medical History: No date: Diabetes mellitus without complication (HCC)     Comment:  Patient takes Janumet No date: GERD (gastroesophageal reflux disease) 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)   Reproductive/Obstetrics negative OB ROS                             Anesthesia Physical Anesthesia Plan  ASA: 2  Anesthesia Plan: General   Post-op Pain Management:    Induction: Intravenous  PONV Risk Score and Plan: 1 and Ondansetron, Dexamethasone, Midazolam and Treatment may vary due to age or medical condition  Airway Management Planned: Oral ETT  Additional Equipment:   Intra-op Plan:   Post-operative Plan: Extubation in OR  Informed Consent: I have reviewed the patients History and Physical, chart,  labs and discussed the procedure including the risks, benefits and alternatives for the proposed anesthesia with the patient or authorized representative who has indicated his/her understanding and acceptance.     Dental Advisory Given  Plan Discussed with: Anesthesiologist, CRNA and Surgeon  Anesthesia Plan Comments:         Anesthesia Quick Evaluation

## 2021-11-14 NOTE — Interval H&P Note (Signed)
History and Physical Interval Note:  11/14/2021 10:42 AM  Jason Weeks  has presented today for surgery, with the diagnosis of K400.90 non-recurrent unilateral inguinal hernia w/o obstruction or gangrene.  The various methods of treatment have been discussed with the patient and family. After consideration of risks, benefits and other options for treatment, the patient has consented to  Procedure(s): XI ROBOTIC ASSISTED BILATERAL INGUINAL HERNIA (Bilateral) as a surgical intervention.  The patient's history has been reviewed, patient examined, no change in status, stable for surgery.  I have reviewed the patient's chart and labs.  Questions were answered to the patient's satisfaction.     Carolan Shiver

## 2021-11-14 NOTE — Discharge Instructions (Addendum)

## 2021-11-14 NOTE — Op Note (Signed)
Preoperative diagnosis: Bilateral inguinal hernia.   Postoperative diagnosis: Bilateral inguinal hernia.  Procedure: Robotic assisted Laparoscopic Transabdominal preperitoneal laparoscopic (TAPP) repair of bilateral inguinal hernia.  Anesthesia: GETA  Surgeon: Dr. Hazle Quant  Wound Classification: Clean  Indications:  Patient is a 51 y.o. male developed a symptomatic bilateral inguinal hernia. Repair was indicated.  Findings: 1. Left indirect Inguinal hernia identified 2. Right direct inguinal hernia 3. Vas deferens and cord structures identified and preserved bilaterally 3. Bard Extra Large 3D Max MID Anatomical mesh used for repair bilaterally  4. Adequate hemostasis.                        Description of procedure: The patient was taken to the operating room and the correct side of surgery was verified. The patient was placed supine with arms tucked at the sides. After obtaining adequate anesthesia, the patients abdomen was prepped and draped in standard sterile fashion. The patient was placed in the Trendelenburg position. A time-out was completed verifying correct patient, procedure, site, positioning, and implant(s) and/or special equipment prior to beginning this procedure. A Veress needle was placed at the umbilicus and pneumoperitoneum created with insufflation of carbon dioxide to 15 mmHg. After the Veress needle was removed, an 8-mm trocar was placed on epigastric area and the 30 angled laparoscope inserted. Two 8-mm trocars were then placed lateral to the rectus sheath under direct visualization. Both inguinal regions were inspected and the median umbilical ligament, medial umbilical ligament, and lateral umbilical fold were identified.  The robotic arms were docked. The robotic scope was inserted and the pelvic area anatomy targeted.  The peritoneum of the left groin was incised with scissors along a line 6 cm above the superior edge of the hernia defect,  extending from the median umbilical ligament to the anterior superior iliac spine. The peritoneal flap was mobilized inferiorly using blunt and sharp dissection. The inferior epigastric vessels were exposed and the pubic symphysis was identified. Coopers ligament was dissected to its junction with the iliac vein. The dissection was continued inferiorly to the iliopubic tract, with care taken to avoid injury to the femoral branch of the genitofemoral nerve and the lateral femoral cutaneous nerve. The cord structures were parietalized. The hernia was identified and reduced by gentle traction.  The hernia sac was noted mobilized from the cord structures and reduced into the peritoneal cavity.  An extra large piece of mesh was rolled longitudinally into a compact cylinder and passed through a trocar. The cylinder was placed along the inferior aspect of the working space and unrolled into place to completely cover the direct, indirect, and femoral spaces. The mesh was secured into place superiorly to the anterior abdominal wall and inferiorly and medially to Coopers ligament with absorbable sutures. Care was taken to avoid the inferolateral triangles containing the iliac vessels and genital nerves. The peritoneal flap was closed over the mesh and secured with suture in similar positions of safety.  The right direct inguinal hernia was repaired using the same technique.  After ensuring adequate hemostasis, the trocars were removed and the pneumoperitoneum allowed to escape. The trocar incisions were closed using monocryl and skin adhesive dressings applied.  The patient tolerated the procedure well and was taken to the postanesthesia care unit in stable condition.   Specimen: None  Complications: None  Estimated Blood Loss: 5 mL

## 2021-11-17 NOTE — Anesthesia Postprocedure Evaluation (Signed)
Anesthesia Post Note  Patient: Jason Weeks  Procedure(s) Performed: XI ROBOTIC ASSISTED BILATERAL INGUINAL HERNIA (Bilateral: Groin)  Patient location during evaluation: PACU Anesthesia Type: General Level of consciousness: awake and alert Pain management: pain level controlled Vital Signs Assessment: post-procedure vital signs reviewed and stable Respiratory status: spontaneous breathing, nonlabored ventilation, respiratory function stable and patient connected to nasal cannula oxygen Cardiovascular status: blood pressure returned to baseline and stable Postop Assessment: no apparent nausea or vomiting Anesthetic complications: no   No notable events documented.   Last Vitals:  Vitals:   11/14/21 1545 11/14/21 1600  BP: 134/74 132/76  Pulse: (!) 51 (!) 52  Resp: 17 17  Temp: (!) 36.3 C (!) 36.1 C  SpO2: 97% 97%    Last Pain:  Vitals:   11/15/21 0911  TempSrc:   PainSc: 2                  Lenard Simmer

## 2023-06-27 ENCOUNTER — Other Ambulatory Visit: Payer: Self-pay | Admitting: Internal Medicine

## 2023-06-27 DIAGNOSIS — R634 Abnormal weight loss: Secondary | ICD-10-CM

## 2023-06-27 DIAGNOSIS — E1165 Type 2 diabetes mellitus with hyperglycemia: Secondary | ICD-10-CM

## 2023-06-27 DIAGNOSIS — F17200 Nicotine dependence, unspecified, uncomplicated: Secondary | ICD-10-CM

## 2023-07-11 ENCOUNTER — Ambulatory Visit
Admission: RE | Admit: 2023-07-11 | Discharge: 2023-07-11 | Disposition: A | Discharge status: Home / Self Care | Payer: BC Managed Care – PPO | Source: Ambulatory Visit | Attending: Internal Medicine | Admitting: Internal Medicine

## 2023-07-11 DIAGNOSIS — F17200 Nicotine dependence, unspecified, uncomplicated: Secondary | ICD-10-CM | POA: Diagnosis present

## 2023-07-11 DIAGNOSIS — E1165 Type 2 diabetes mellitus with hyperglycemia: Secondary | ICD-10-CM | POA: Insufficient documentation

## 2023-07-11 DIAGNOSIS — R634 Abnormal weight loss: Secondary | ICD-10-CM | POA: Diagnosis present

## 2023-07-11 MED ORDER — IOHEXOL 300 MG/ML  SOLN
75.0000 mL | Freq: Once | INTRAMUSCULAR | Status: AC | PRN
  Administered 2023-07-11: 75 mL via INTRAVENOUS

## 2023-12-26 ENCOUNTER — Ambulatory Visit: Payer: BC Managed Care – PPO

## 2023-12-26 DIAGNOSIS — K621 Rectal polyp: Secondary | ICD-10-CM | POA: Diagnosis not present

## 2023-12-26 DIAGNOSIS — K2950 Unspecified chronic gastritis without bleeding: Secondary | ICD-10-CM | POA: Diagnosis not present

## 2023-12-26 DIAGNOSIS — K640 First degree hemorrhoids: Secondary | ICD-10-CM | POA: Diagnosis not present

## 2023-12-26 DIAGNOSIS — D120 Benign neoplasm of cecum: Secondary | ICD-10-CM | POA: Diagnosis not present

## 2023-12-26 DIAGNOSIS — Z1211 Encounter for screening for malignant neoplasm of colon: Secondary | ICD-10-CM | POA: Diagnosis present
